# Patient Record
Sex: Female | Born: 1940 | Race: White | Hispanic: No | Marital: Single | Smoking: Never smoker
Health system: Southern US, Community
[De-identification: ages and names within clinical notes are randomized; demographics above are authoritative.]

## PROBLEM LIST (undated history)

## (undated) DIAGNOSIS — J039 Acute tonsillitis, unspecified: Secondary | ICD-10-CM

## (undated) DIAGNOSIS — K219 Gastro-esophageal reflux disease without esophagitis: Secondary | ICD-10-CM

## (undated) DIAGNOSIS — K589 Irritable bowel syndrome without diarrhea: Secondary | ICD-10-CM

## (undated) DIAGNOSIS — M797 Fibromyalgia: Secondary | ICD-10-CM

## (undated) DIAGNOSIS — N3281 Overactive bladder: Secondary | ICD-10-CM

## (undated) DIAGNOSIS — N951 Menopausal and female climacteric states: Secondary | ICD-10-CM

## (undated) DIAGNOSIS — I7300 Raynaud's syndrome without gangrene: Secondary | ICD-10-CM

## (undated) DIAGNOSIS — M549 Dorsalgia, unspecified: Secondary | ICD-10-CM

## (undated) DIAGNOSIS — D510 Vitamin B12 deficiency anemia due to intrinsic factor deficiency: Secondary | ICD-10-CM

## (undated) DIAGNOSIS — R9431 Abnormal electrocardiogram [ECG] [EKG]: Secondary | ICD-10-CM

## (undated) DIAGNOSIS — E119 Type 2 diabetes mellitus without complications: Secondary | ICD-10-CM

## (undated) DIAGNOSIS — I10 Essential (primary) hypertension: Secondary | ICD-10-CM

## (undated) DIAGNOSIS — F32A Depression, unspecified: Secondary | ICD-10-CM

## (undated) DIAGNOSIS — R519 Headache, unspecified: Secondary | ICD-10-CM

## (undated) DIAGNOSIS — E785 Hyperlipidemia, unspecified: Secondary | ICD-10-CM

## (undated) DIAGNOSIS — N183 Chronic kidney disease, stage 3 unspecified: Secondary | ICD-10-CM

## (undated) DIAGNOSIS — E559 Vitamin D deficiency, unspecified: Secondary | ICD-10-CM

## (undated) HISTORY — PX: OTHER SURGICAL HISTORY: SHX169

## (undated) HISTORY — DX: Irritable bowel syndrome, unspecified: K58.9

## (undated) HISTORY — DX: Depression, unspecified: F32.A

## (undated) HISTORY — DX: Menopausal and female climacteric states: N95.1

## (undated) HISTORY — DX: Abnormal electrocardiogram (ECG) (EKG): R94.31

## (undated) HISTORY — DX: Overactive bladder: N32.81

## (undated) HISTORY — DX: Vitamin B12 deficiency anemia due to intrinsic factor deficiency: D51.0

## (undated) HISTORY — DX: Fibromyalgia: M79.7

## (undated) HISTORY — DX: Acute tonsillitis, unspecified: J03.90

## (undated) HISTORY — DX: Vitamin D deficiency, unspecified: E55.9

## (undated) HISTORY — DX: Raynaud's syndrome without gangrene: I73.00

## (undated) HISTORY — DX: Gastro-esophageal reflux disease without esophagitis: K21.9

## (undated) HISTORY — DX: Dorsalgia, unspecified: M54.9

## (undated) HISTORY — PX: ABDOMINAL HYSTERECTOMY: SHX81

## (undated) HISTORY — DX: Headache, unspecified: R51.9

## (undated) HISTORY — DX: Chronic kidney disease, stage 3 unspecified: N18.30

---

## 2000-05-14 ENCOUNTER — Encounter: Payer: Self-pay | Admitting: Internal Medicine

## 2000-05-14 ENCOUNTER — Encounter: Admission: RE | Admit: 2000-05-14 | Discharge: 2000-05-14 | Payer: Self-pay | Admitting: Internal Medicine

## 2001-06-07 ENCOUNTER — Other Ambulatory Visit: Admission: RE | Admit: 2001-06-07 | Discharge: 2001-06-07 | Payer: Self-pay | Admitting: Internal Medicine

## 2002-02-28 ENCOUNTER — Encounter: Admission: RE | Admit: 2002-02-28 | Discharge: 2002-02-28 | Payer: Self-pay | Admitting: Internal Medicine

## 2002-02-28 ENCOUNTER — Encounter: Payer: Self-pay | Admitting: Internal Medicine

## 2002-11-09 ENCOUNTER — Other Ambulatory Visit: Admission: RE | Admit: 2002-11-09 | Discharge: 2002-11-09 | Payer: Self-pay | Admitting: Internal Medicine

## 2008-03-22 ENCOUNTER — Encounter: Admission: RE | Admit: 2008-03-22 | Discharge: 2008-03-22 | Payer: Self-pay | Admitting: Internal Medicine

## 2009-04-25 ENCOUNTER — Encounter: Admission: RE | Admit: 2009-04-25 | Discharge: 2009-04-25 | Payer: Self-pay | Admitting: Internal Medicine

## 2009-05-22 ENCOUNTER — Encounter: Admission: RE | Admit: 2009-05-22 | Discharge: 2009-05-22 | Payer: Self-pay | Admitting: Orthopedic Surgery

## 2009-09-16 ENCOUNTER — Encounter: Admission: RE | Admit: 2009-09-16 | Discharge: 2009-09-16 | Payer: Self-pay | Admitting: Internal Medicine

## 2009-09-17 ENCOUNTER — Encounter: Admission: RE | Admit: 2009-09-17 | Discharge: 2009-09-17 | Payer: Self-pay | Admitting: Internal Medicine

## 2011-02-13 ENCOUNTER — Ambulatory Visit: Payer: Self-pay | Admitting: *Deleted

## 2011-02-13 NOTE — Progress Notes (Signed)
Opened in error

## 2012-07-22 ENCOUNTER — Emergency Department (HOSPITAL_COMMUNITY): Payer: Medicare HMO

## 2012-07-22 ENCOUNTER — Emergency Department (HOSPITAL_COMMUNITY)
Admission: EM | Admit: 2012-07-22 | Discharge: 2012-07-23 | Disposition: A | Discharge status: Home / Self Care | Payer: Medicare HMO | Attending: Emergency Medicine | Admitting: Emergency Medicine

## 2012-07-22 ENCOUNTER — Encounter (HOSPITAL_COMMUNITY): Payer: Self-pay | Admitting: *Deleted

## 2012-07-22 ENCOUNTER — Other Ambulatory Visit: Payer: Self-pay

## 2012-07-22 DIAGNOSIS — E785 Hyperlipidemia, unspecified: Secondary | ICD-10-CM | POA: Insufficient documentation

## 2012-07-22 DIAGNOSIS — S2220XA Unspecified fracture of sternum, initial encounter for closed fracture: Secondary | ICD-10-CM | POA: Insufficient documentation

## 2012-07-22 DIAGNOSIS — Z79899 Other long term (current) drug therapy: Secondary | ICD-10-CM | POA: Insufficient documentation

## 2012-07-22 DIAGNOSIS — E119 Type 2 diabetes mellitus without complications: Secondary | ICD-10-CM | POA: Insufficient documentation

## 2012-07-22 DIAGNOSIS — Y92410 Unspecified street and highway as the place of occurrence of the external cause: Secondary | ICD-10-CM | POA: Insufficient documentation

## 2012-07-22 DIAGNOSIS — IMO0001 Reserved for inherently not codable concepts without codable children: Secondary | ICD-10-CM | POA: Insufficient documentation

## 2012-07-22 DIAGNOSIS — I10 Essential (primary) hypertension: Secondary | ICD-10-CM | POA: Insufficient documentation

## 2012-07-22 DIAGNOSIS — Y9389 Activity, other specified: Secondary | ICD-10-CM | POA: Insufficient documentation

## 2012-07-22 HISTORY — DX: Essential (primary) hypertension: I10

## 2012-07-22 HISTORY — DX: Hyperlipidemia, unspecified: E78.5

## 2012-07-22 HISTORY — DX: Type 2 diabetes mellitus without complications: E11.9

## 2012-07-22 LAB — POCT I-STAT, CHEM 8
BUN: 29 mg/dL — ABNORMAL HIGH (ref 6–23)
Calcium, Ion: 1.14 mmol/L (ref 1.13–1.30)
Chloride: 106 mEq/L (ref 96–112)
Creatinine, Ser: 1.3 mg/dL — ABNORMAL HIGH (ref 0.50–1.10)
Glucose, Bld: 134 mg/dL — ABNORMAL HIGH (ref 70–99)
HCT: 40 % (ref 36.0–46.0)
Hemoglobin: 13.6 g/dL (ref 12.0–15.0)
Potassium: 3.8 mEq/L (ref 3.5–5.1)
Sodium: 138 mEq/L (ref 135–145)
TCO2: 26 mmol/L (ref 0–100)

## 2012-07-22 LAB — POCT I-STAT TROPONIN I: Troponin i, poc: 0.01 ng/mL (ref 0.00–0.08)

## 2012-07-22 MED ORDER — OXYCODONE-ACETAMINOPHEN 5-325 MG PO TABS
1.0000 | ORAL_TABLET | Freq: Once | ORAL | Status: AC
  Administered 2012-07-23: 1 via ORAL
  Filled 2012-07-22: qty 1

## 2012-07-22 MED ORDER — PERCOCET 5-325 MG PO TABS: 1.0000 | ORAL_TABLET | Freq: Four times a day (QID) | ORAL | Status: DC | PRN

## 2012-07-22 MED ORDER — OXYCODONE-ACETAMINOPHEN 5-325 MG PO TABS
1.0000 | ORAL_TABLET | Freq: Once | ORAL | Status: AC
  Administered 2012-07-22: 1 via ORAL
  Filled 2012-07-22: qty 1

## 2012-07-22 MED ORDER — IBUPROFEN 800 MG PO TABS
800.0000 mg | ORAL_TABLET | Freq: Once | ORAL | Status: AC
  Administered 2012-07-22: 800 mg via ORAL
  Filled 2012-07-22: qty 1

## 2012-07-22 NOTE — ED Notes (Signed)
Bed:WA11<BR> Expected date:<BR> Expected time:<BR> Means of arrival:<BR> Comments:<BR> ems
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
## 2012-07-23 NOTE — ED Provider Notes (Signed)
Medical screening examination/treatment/procedure(s) were performed by non-physician practitioner and as supervising physician I was immediately available for consultation/collaboration.  Raeford Razor, MD 07/23/12 1444

## 2012-07-27 NOTE — ED Provider Notes (Signed)
Medical screening examination/treatment/procedure(s) were performed by non-physician practitioner and as supervising physician I was immediately available for consultation/collaboration.  Eymi Lipuma T Sabastian Raimondi, MD 07/27/12 2305 

## 2012-09-05 ENCOUNTER — Other Ambulatory Visit: Payer: Self-pay | Admitting: Internal Medicine

## 2012-09-05 ENCOUNTER — Ambulatory Visit
Admission: RE | Admit: 2012-09-05 | Discharge: 2012-09-05 | Disposition: A | Discharge status: Home / Self Care | Payer: Medicare HMO | Source: Ambulatory Visit | Attending: Internal Medicine | Admitting: Internal Medicine

## 2012-09-05 DIAGNOSIS — J939 Pneumothorax, unspecified: Secondary | ICD-10-CM

## 2013-02-13 ENCOUNTER — Other Ambulatory Visit: Payer: Self-pay | Admitting: Internal Medicine

## 2013-02-13 DIAGNOSIS — Z1231 Encounter for screening mammogram for malignant neoplasm of breast: Secondary | ICD-10-CM

## 2013-02-27 ENCOUNTER — Ambulatory Visit: Payer: Medicare HMO

## 2013-04-10 ENCOUNTER — Ambulatory Visit
Admission: RE | Admit: 2013-04-10 | Discharge: 2013-04-10 | Disposition: A | Discharge status: Home / Self Care | Payer: Medicare HMO | Source: Ambulatory Visit | Attending: Internal Medicine | Admitting: Internal Medicine

## 2013-04-10 DIAGNOSIS — Z1231 Encounter for screening mammogram for malignant neoplasm of breast: Secondary | ICD-10-CM

## 2013-04-11 ENCOUNTER — Other Ambulatory Visit: Payer: Self-pay | Admitting: Internal Medicine

## 2013-04-17 ENCOUNTER — Other Ambulatory Visit: Payer: Medicare HMO

## 2013-04-18 ENCOUNTER — Other Ambulatory Visit: Payer: Medicare HMO

## 2013-04-21 ENCOUNTER — Other Ambulatory Visit: Payer: Medicare HMO

## 2013-04-28 ENCOUNTER — Other Ambulatory Visit: Payer: Medicare HMO

## 2013-05-04 ENCOUNTER — Ambulatory Visit
Admission: RE | Admit: 2013-05-04 | Discharge: 2013-05-04 | Disposition: A | Discharge status: Home / Self Care | Payer: Medicare HMO | Source: Ambulatory Visit | Attending: Internal Medicine | Admitting: Internal Medicine

## 2013-05-04 MED ORDER — GADOBENATE DIMEGLUMINE 529 MG/ML IV SOLN
18.0000 mL | Freq: Once | INTRAVENOUS | Status: AC | PRN
  Administered 2013-05-04: 18 mL via INTRAVENOUS
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
## 2016-11-22 ENCOUNTER — Encounter (HOSPITAL_COMMUNITY): Payer: Self-pay | Admitting: Emergency Medicine

## 2016-11-22 ENCOUNTER — Emergency Department (HOSPITAL_COMMUNITY)
Admission: EM | Admit: 2016-11-22 | Discharge: 2016-11-22 | Disposition: A | Discharge status: Home / Self Care | Payer: Medicare Other | Attending: Emergency Medicine | Admitting: Emergency Medicine

## 2016-11-22 DIAGNOSIS — W57XXXA Bitten or stung by nonvenomous insect and other nonvenomous arthropods, initial encounter: Secondary | ICD-10-CM | POA: Insufficient documentation

## 2016-11-22 DIAGNOSIS — I10 Essential (primary) hypertension: Secondary | ICD-10-CM | POA: Diagnosis not present

## 2016-11-22 DIAGNOSIS — Y999 Unspecified external cause status: Secondary | ICD-10-CM | POA: Insufficient documentation

## 2016-11-22 DIAGNOSIS — Z79899 Other long term (current) drug therapy: Secondary | ICD-10-CM | POA: Insufficient documentation

## 2016-11-22 DIAGNOSIS — Z7984 Long term (current) use of oral hypoglycemic drugs: Secondary | ICD-10-CM | POA: Diagnosis not present

## 2016-11-22 DIAGNOSIS — E119 Type 2 diabetes mellitus without complications: Secondary | ICD-10-CM | POA: Insufficient documentation

## 2016-11-22 DIAGNOSIS — Y929 Unspecified place or not applicable: Secondary | ICD-10-CM | POA: Diagnosis not present

## 2016-11-22 DIAGNOSIS — Y939 Activity, unspecified: Secondary | ICD-10-CM | POA: Insufficient documentation

## 2016-11-22 DIAGNOSIS — S00462A Insect bite (nonvenomous) of left ear, initial encounter: Secondary | ICD-10-CM | POA: Diagnosis not present

## 2016-11-22 DIAGNOSIS — Z7982 Long term (current) use of aspirin: Secondary | ICD-10-CM | POA: Diagnosis not present

## 2016-11-22 DIAGNOSIS — L03811 Cellulitis of head [any part, except face]: Secondary | ICD-10-CM

## 2016-11-22 MED ORDER — CLINDAMYCIN HCL 300 MG PO CAPS: 300.0000 mg | ORAL_CAPSULE | Freq: Four times a day (QID) | ORAL | 0 refills | Status: DC

## 2016-11-22 MED ORDER — CLINDAMYCIN PHOSPHATE 900 MG/50ML IV SOLN
900.0000 mg | Freq: Once | INTRAVENOUS | Status: AC
  Administered 2016-11-22: 900 mg via INTRAVENOUS
  Filled 2016-11-22: qty 50

## 2016-11-22 NOTE — ED Provider Notes (Signed)
[facility]-EMERGENCY DEPT Provider Note   CSN: [id number] Arrival date & time: 11/22/16  1726     History   Chief Complaint Chief Complaint  Patient presents with  . Insect Bite    HPI Jo Levy is a 76 y.o. female. CC: Red swollen ear after tick bite.  HPI:  76 year old female who started noticing that her left ear was red and tender on Tuesday, 5 days ago. 3 days ago she put her fingernail into one of the folds of her upper anterior ear and produced a tick. She states it was only a trace of blood.  Her ear has continued red and swollen since that time and she presents here. She feels well in general. She does not have fever shakes or chills.  Past Medical History:  Diagnosis Date  . Diabetes mellitus without complication (HCC)   . Hyperlipemia   . Hypertension     There are no active problems to display for this patient.   History reviewed. No pertinent surgical history.  OB History    No data available       Home Medications    Prior to Admission medications   Medication Sig Start Date End Date Taking? Authorizing Provider  acetaminophen (TYLENOL) 325 MG tablet Take 650 mg by mouth every 6 (six) hours as needed (pain).   Yes [provider]  aspirin EC 81 MG tablet Take 81 mg by mouth daily.   Yes [provider]  Cholecalciferol (VITAMIN D PO) Take 1 tablet by mouth daily.   Yes [provider]  escitalopram (LEXAPRO) 20 MG tablet Take 20 mg by mouth every morning.   Yes [provider]  estradiol (ESTRACE) 1 MG tablet Take 1 mg by mouth every morning.   Yes [provider]  lisinopril-hydrochlorothiazide (PRINZIDE,ZESTORETIC) 20-12.5 MG tablet Take 1 tablet by mouth daily. 09/03/16  Yes [provider]  metFORMIN (GLUCOPHAGE-XR) 500 MG 24 hr tablet Take 500 mg by mouth daily. 09/20/16  Yes [provider]  VITAMIN E PO Take 1 tablet by mouth daily.   Yes [provider]  clindamycin  (CLEOCIN) 300 MG capsule Take 1 capsule (300 mg total) by mouth every 6 (six) hours. 11/22/16   Rolland PorterJames, Joram Venson, MD    Family History No family history on file.  Social History Social History  Substance Use Topics  . Smoking status: Never Smoker  . Smokeless tobacco: Not on file  . Alcohol use No     Allergies   Iodine; Iohexol; and Penicillins   Review of Systems Review of Systems  Constitutional: Negative for appetite change, chills, diaphoresis, fatigue and fever.  HENT: Positive for ear pain. Negative for mouth sores, sore throat and trouble swallowing.   Eyes: Negative for visual disturbance.  Respiratory: Negative for cough, chest tightness, shortness of breath and wheezing.   Cardiovascular: Negative for chest pain.  Gastrointestinal: Negative for abdominal distention, abdominal pain, diarrhea, nausea and vomiting.  Endocrine: Negative for polydipsia, polyphagia and polyuria.  Genitourinary: Negative for dysuria, frequency and hematuria.  Musculoskeletal: Negative for gait problem.  Skin: Negative for color change, pallor and rash.  Neurological: Negative for dizziness, syncope, light-headedness and headaches.  Hematological: Does not bruise/bleed easily.  Psychiatric/Behavioral: Negative for behavioral problems and confusion.     Physical Exam Updated Vital Signs BP (!) 146/62 (BP Location: Left Arm)   Pulse 67   Temp 98.1 F (36.7 C) (Oral)   Resp 20   Ht 5\' 6"  (1.676 m)  Wt 186 lb (84.4 kg)   SpO2 100%   BMI 30.02 kg/m   Physical Exam  Constitutional: She is oriented to person, place, and time. She appears well-developed and well-nourished. No distress.  HENT:  Head: Normocephalic.  Left ear diffusely erythematous. No localized abscess. No sign of fluid collection or separation of skin and cartilage. Smooth contours. No mastoiditis or erythema over the scalp. Normal canal.  Eyes: Conjunctivae are normal. Pupils are equal, round, and reactive to light. No  scleral icterus.  Neck: Normal range of motion. Neck supple. No thyromegaly present.  Cardiovascular: Normal rate and regular rhythm.  Exam reveals no gallop and no friction rub.   No murmur heard. Pulmonary/Chest: Effort normal and breath sounds normal. No respiratory distress. She has no wheezes. She has no rales.  Abdominal: Soft. Bowel sounds are normal. She exhibits no distension. There is no tenderness. There is no rebound.  Musculoskeletal: Normal range of motion.  Neurological: She is alert and oriented to person, place, and time.  Skin: Skin is warm and dry. No rash noted.  Psychiatric: She has a normal mood and affect. Her behavior is normal.     ED Treatments / Results  Labs (all labs ordered are listed, but only abnormal results are displayed) Labs Reviewed - No data to display  EKG  EKG Interpretation None       Radiology No results found.  Procedures Procedures (including critical care time)  Medications Ordered in ED Medications  clindamycin (CLEOCIN) IVPB 900 mg (900 mg Intravenous New Bag/Given 11/22/16 2030)     Initial Impression / Assessment and Plan / ED Course  I have reviewed the triage vital signs and the nursing notes.  Pertinent labs & imaging results that were available during my care of the patient were reviewed by me and considered in my medical decision making (see chart for details).     Ear cellulitis. Given IV clindamycin and she does not have tolerance of antibiotics or cephalosporins. Plan discharge home. 300 clindamycin 3 times a day. Primary care not resolving. ER with acute changes.  Final Clinical Impressions(s) / ED Diagnoses   Final diagnoses:  Cellulitis of head except face    New Prescriptions New Prescriptions   CLINDAMYCIN (CLEOCIN) 300 MG CAPSULE    Take 1 capsule (300 mg total) by mouth every 6 (six) hours.     Rolland Porter, MD 11/22/16 2132

## 2016-11-22 NOTE — ED Notes (Signed)
PT DISCHARGED. INSTRUCTIONS AND PRESCRIPTION GIVEN. AAOX4. PT IN NO APPARENT DISTRESS OR PAIN. THE OPPORTUNITY TO ASK QUESTIONS WAS PROVIDED. 

## 2016-11-22 NOTE — Discharge Instructions (Signed)
Follow-up with your physician if not improving within the next 48-72 hours.

## 2016-11-22 NOTE — ED Triage Notes (Signed)
Pt from home following a tick bite on the upper inner aspect of her left ear. Pt states she thinks the tick was attached for a few days. Pt has swelling on the outer area of her ear that is warm to the touch. Pt states she went to an urgent care and was told to immediately come here.

## 2016-12-30 ENCOUNTER — Ambulatory Visit
Admission: RE | Admit: 2016-12-30 | Discharge: 2016-12-30 | Disposition: A | Discharge status: Home / Self Care | Payer: Medicare Other | Source: Ambulatory Visit | Attending: Internal Medicine | Admitting: Internal Medicine

## 2016-12-30 ENCOUNTER — Other Ambulatory Visit: Payer: Self-pay | Admitting: Internal Medicine

## 2016-12-30 DIAGNOSIS — M545 Low back pain, unspecified: Secondary | ICD-10-CM

## 2017-01-12 ENCOUNTER — Other Ambulatory Visit: Payer: Self-pay | Admitting: Internal Medicine

## 2017-01-12 DIAGNOSIS — Z1231 Encounter for screening mammogram for malignant neoplasm of breast: Secondary | ICD-10-CM

## 2017-01-26 ENCOUNTER — Ambulatory Visit
Admission: RE | Admit: 2017-01-26 | Discharge: 2017-01-26 | Disposition: A | Discharge status: Home / Self Care | Payer: Medicare Other | Source: Ambulatory Visit | Attending: Internal Medicine | Admitting: Internal Medicine

## 2017-01-26 DIAGNOSIS — Z1231 Encounter for screening mammogram for malignant neoplasm of breast: Secondary | ICD-10-CM

## 2017-01-29 ENCOUNTER — Other Ambulatory Visit: Payer: Self-pay | Admitting: Internal Medicine

## 2017-01-29 DIAGNOSIS — N644 Mastodynia: Secondary | ICD-10-CM

## 2017-02-10 ENCOUNTER — Inpatient Hospital Stay: Admission: RE | Admit: 2017-02-10 | Payer: Medicare Other | Source: Ambulatory Visit

## 2017-02-10 ENCOUNTER — Other Ambulatory Visit: Payer: Self-pay | Admitting: Internal Medicine

## 2017-02-10 DIAGNOSIS — N644 Mastodynia: Secondary | ICD-10-CM

## 2017-02-15 ENCOUNTER — Other Ambulatory Visit: Payer: Self-pay | Admitting: Orthopaedic Surgery

## 2017-02-15 DIAGNOSIS — M546 Pain in thoracic spine: Secondary | ICD-10-CM

## 2017-02-17 ENCOUNTER — Ambulatory Visit
Admission: RE | Admit: 2017-02-17 | Discharge: 2017-02-17 | Disposition: A | Discharge status: Home / Self Care | Payer: Medicare Other | Source: Ambulatory Visit | Attending: Internal Medicine | Admitting: Internal Medicine

## 2017-02-17 ENCOUNTER — Ambulatory Visit: Payer: Medicare Other

## 2017-02-17 ENCOUNTER — Other Ambulatory Visit: Payer: Self-pay | Admitting: Internal Medicine

## 2017-02-17 DIAGNOSIS — N644 Mastodynia: Secondary | ICD-10-CM

## 2017-03-09 ENCOUNTER — Ambulatory Visit
Admission: RE | Admit: 2017-03-09 | Discharge: 2017-03-09 | Disposition: A | Discharge status: Home / Self Care | Payer: Medicare Other | Source: Ambulatory Visit | Attending: Orthopaedic Surgery | Admitting: Orthopaedic Surgery

## 2017-03-09 DIAGNOSIS — M546 Pain in thoracic spine: Secondary | ICD-10-CM

## 2017-03-17 ENCOUNTER — Other Ambulatory Visit: Payer: Self-pay | Admitting: Orthopaedic Surgery

## 2017-03-17 DIAGNOSIS — M545 Low back pain: Secondary | ICD-10-CM

## 2017-03-31 ENCOUNTER — Ambulatory Visit
Admission: RE | Admit: 2017-03-31 | Discharge: 2017-03-31 | Disposition: A | Discharge status: Home / Self Care | Payer: Medicare Other | Source: Ambulatory Visit | Attending: Orthopaedic Surgery | Admitting: Orthopaedic Surgery

## 2017-03-31 ENCOUNTER — Other Ambulatory Visit: Payer: Self-pay | Admitting: Internal Medicine

## 2017-03-31 DIAGNOSIS — M545 Low back pain: Secondary | ICD-10-CM

## 2017-03-31 DIAGNOSIS — J984 Other disorders of lung: Secondary | ICD-10-CM

## 2017-04-02 ENCOUNTER — Other Ambulatory Visit: Payer: Medicare Other

## 2017-04-07 ENCOUNTER — Ambulatory Visit
Admission: RE | Admit: 2017-04-07 | Discharge: 2017-04-07 | Disposition: A | Discharge status: Home / Self Care | Payer: Medicare Other | Source: Ambulatory Visit | Attending: Internal Medicine | Admitting: Internal Medicine

## 2017-04-07 DIAGNOSIS — J984 Other disorders of lung: Secondary | ICD-10-CM

## 2020-03-27 ENCOUNTER — Other Ambulatory Visit: Payer: Self-pay | Admitting: Internal Medicine

## 2020-03-27 DIAGNOSIS — R5381 Other malaise: Secondary | ICD-10-CM

## 2020-04-01 ENCOUNTER — Other Ambulatory Visit: Payer: Self-pay | Admitting: Internal Medicine

## 2020-04-01 DIAGNOSIS — E559 Vitamin D deficiency, unspecified: Secondary | ICD-10-CM

## 2021-08-07 ENCOUNTER — Ambulatory Visit: Payer: Medicare Other | Admitting: Orthopaedic Surgery

## 2022-06-19 ENCOUNTER — Other Ambulatory Visit: Payer: Self-pay | Admitting: Internal Medicine

## 2022-06-19 ENCOUNTER — Ambulatory Visit
Admission: RE | Admit: 2022-06-19 | Discharge: 2022-06-19 | Disposition: A | Discharge status: Home / Self Care | Payer: Medicare Other | Source: Ambulatory Visit | Attending: Internal Medicine | Admitting: Internal Medicine

## 2022-06-19 ENCOUNTER — Encounter: Payer: Self-pay | Admitting: Internal Medicine

## 2022-06-19 DIAGNOSIS — M25511 Pain in right shoulder: Secondary | ICD-10-CM

## 2022-12-23 ENCOUNTER — Telehealth: Payer: Self-pay

## 2022-12-23 NOTE — Telephone Encounter (Signed)
LVM to return call to schedule OV 

## 2022-12-23 NOTE — Telephone Encounter (Signed)
-----   Message from Tyrell Antonio, MD sent at 12/21/2022  4:13 PM EDT ----- Ov  ----- Message ----- From: Sharlet Salina, CMA Sent: 12/21/2022   2:37 PM EDT To: Tyrell Antonio, MD; Juanda Chance, NP   ----- Message ----- From: Dorcas Mcmurray Sent: 12/21/2022   2:30 PM EDT To: Sharlet Salina, CMA

## 2022-12-24 ENCOUNTER — Telehealth: Payer: Self-pay | Admitting: Physical Medicine and Rehabilitation

## 2022-12-24 NOTE — Telephone Encounter (Signed)
Pt called requesting a cal back from brittany. Pt states a referral was sent for her but she has a few questions before making appt. Please call at [phone]

## 2022-12-29 ENCOUNTER — Telehealth: Payer: Self-pay

## 2022-12-29 NOTE — Telephone Encounter (Signed)
-----   Message from Frederic Newton, MD sent at 12/21/2022  4:13 PM EDT ----- Ov  ----- Message ----- From: Joci Dress H, CMA Sent: 12/21/2022   2:37 PM EDT To: Frederic Newton, MD; Megan E Williams, NP   ----- Message ----- From: Hughes, Tammy Sent: 12/21/2022   2:30 PM EDT To: Jermichael Belmares H Tymir Terral, CMA    

## 2022-12-29 NOTE — Telephone Encounter (Signed)
Spoke with patient and she questioned what exactly could be done for her back with her arthritis. After explaining that we can do back injections, short term medication and PT, se stated that this is not where she thinks she needs to be. She stated that she had back injections before an they did not help at all. She is going to talk with Dr. Ludwig Clarks to let him know what she explained to me.

## 2022-12-29 NOTE — Telephone Encounter (Signed)
Spoke with patient and she questioned what exactly could be done for her back with her arthritis. After explaining that we can do back injections, short term medication and PT, se stated that this is not where she thinks she needs to be. She stated that she had back injections before an they did not help at all. She is going to talk with Dr. Moreira to let him know what she explained to me.  

## 2023-10-13 ENCOUNTER — Ambulatory Visit: Payer: Medicare Other | Attending: Cardiology | Admitting: Cardiology

## 2023-10-13 ENCOUNTER — Encounter: Payer: Self-pay | Admitting: Cardiology

## 2023-10-13 VITALS — BP 138/82 | HR 64 | Resp 16 | Ht 66.0 in | Wt 182.6 lb

## 2023-10-13 DIAGNOSIS — R0609 Other forms of dyspnea: Secondary | ICD-10-CM | POA: Insufficient documentation

## 2023-10-13 DIAGNOSIS — R011 Cardiac murmur, unspecified: Secondary | ICD-10-CM | POA: Diagnosis not present

## 2023-10-13 NOTE — Progress Notes (Signed)
 Cardiology Office Note:  .   Date:  10/13/2023  ID:  Jo Levy, DOB 23-Feb-1941, MRN [id number] PCP: Ralene Ok, MD  [facility] Providers Cardiologist:  Truett Mainland, MD PCP: Ralene Ok, MD  Chief Complaint  Patient presents with   Shortness of Breath   Follow-up     Jo Levy is a 83 y.o. female with exertional dyspnea  Discussed the use of AI scribe software for clinical note transcription with the patient, who gave verbal consent to proceed.  History of Present Illness The patient, with a history of COPD, presents with fatigue and shortness of breath. She reports a significant decrease in activity level over the past year, with a marked decline in the past few months. She describes feeling 'exhausted all the time' and needing to sleep frequently. She also reports difficulty with tasks she used to perform easily, such as housework. She has to rest after walking to the mailbox due to shortness of breath. She denies chest pain but reports a sensation of not being able to take a deep breath and back pain. She has a history of significant secondhand smoke exposure but denies personal history of smoking. She has a family history of esophageal cancer. She also has a history of diabetes and hypertension, for which she takes metformin and lisinopril hydrochlorothiazide, respectively.      There were no vitals filed for this visit.    Review of Systems  Constitutional: Positive for malaise/fatigue.  Cardiovascular:  Positive for dyspnea on exertion. Negative for chest pain, leg swelling, palpitations and syncope.        Studies Reviewed: Marland Kitchen        EKG 10/13/2023: Normal sinus rhythm Normal ECG    Independently interpreted Cr 1.5  CTA chest 07/2023: No PE Mosaic pattern of dominant mass throughout both lungs No coronary calcification No acute thoracic aortic abnormalities Aberrant right subclavian artery    Physical Exam Vitals and  nursing note reviewed.  Constitutional:      General: She is not in acute distress. Neck:     Vascular: No JVD.  Cardiovascular:     Rate and Rhythm: Normal rate and regular rhythm.     Heart sounds: Murmur heard.     Harsh midsystolic murmur is present with a grade of 2/6 at the upper right sternal border radiating to the neck.  Pulmonary:     Effort: Pulmonary effort is normal.     Breath sounds: Normal breath sounds. No wheezing or rales.  Musculoskeletal:     Right lower leg: No edema.     Left lower leg: No edema.      VISIT DIAGNOSES:   ICD-10-CM   1. Exertional dyspnea  R06.09 EKG 12-Lead    Pro b natriuretic peptide (BNP)    2. Murmur  R01.1 ECHOCARDIOGRAM COMPLETE       Jo Levy is a 83 y.o. female with exertional dyspnea  Assessment & Plan  Exertional dyspnea: Physical exam remarkable for systolic murmur, likely moderate aortic stenosis.  However, she is euvolemic on exam.  I do not think this explains her exertional dyspnea, but will likely need surveillance echocardiogram in the future.  Will check proBNP.  Recent CT chest did show groundglass opacities, suspected.  Continue follow-up with PCP regarding further management for possible COPD. Plan to obtain recent lab results for comprehensive evaluation. - Obtain recent lab results from primary care physician's office.     F/u as needed  Signed, Elder Negus, MD

## 2023-10-13 NOTE — Patient Instructions (Signed)
 Medication Instructions:   Your physician recommends that you continue on your current medications as directed. Please refer to the Current Medication list given to you today.  *If you need a refill on your cardiac medications before your next appointment, please call your pharmacy*  Lab Work:  TODAY--DOWNSTAIRS FIRST FLOOR AT [facility]  If you have labs (blood work) drawn today and your tests are completely normal, you will receive your results only by: MyChart Message (if you have MyChart) OR A paper copy in the mail If you have any lab test that is abnormal or we need to change your treatment, we will call you to review the results.   Testing/Procedures:  Your physician has requested that you have an echocardiogram. Echocardiography is a painless test that uses sound waves to create images of your heart. It provides your doctor with information about the size and shape of your heart and how well your heart's chambers and valves are working. This procedure takes approximately one hour. There are no restrictions for this procedure. Please do NOT wear cologne, perfume, aftershave, or lotions (deodorant is allowed). Please arrive 15 minutes prior to your appointment time.  Please note: We ask at that you not bring children with you during ultrasound (echo/ vascular) testing. Due to room size and safety concerns, children are not allowed in the ultrasound rooms during exams. Our front office staff cannot provide observation of children in our lobby area while testing is being conducted. An adult accompanying a patient to their appointment will only be allowed in the ultrasound room at the discretion of the ultrasound technician under special circumstances. We apologize for any inconvenience.   Follow-Up:  AS NEEDED WITH DR. PATWARDHAN         1st Floor: - Lobby - Registration  - Pharmacy  - Lab - Cafe  2nd Floor: - PV Lab - Diagnostic Testing (echo, CT, nuclear  med)  3rd Floor: - Vacant  4th Floor: - TCTS (cardiothoracic surgery) - AFib Clinic - Structural Heart Clinic - Vascular Surgery  - Vascular Ultrasound  5th Floor: - HeartCare Cardiology (general and EP) - Clinical Pharmacy for coumadin, hypertension, lipid, weight-loss medications, and med management appointments    Valet parking services will be available as well.

## 2023-10-14 LAB — PRO B NATRIURETIC PEPTIDE: NT-Pro BNP: 359 pg/mL (ref 0–738)

## 2023-10-14 NOTE — Progress Notes (Signed)
 proBNP is normal.  I do not think shortness of breath is due to cardiac cause. Will await echocardiogram to further comment on heart valves.  Thanks MJP

## 2023-11-22 ENCOUNTER — Ambulatory Visit (HOSPITAL_COMMUNITY): Attending: Cardiology

## 2023-11-22 DIAGNOSIS — R011 Cardiac murmur, unspecified: Secondary | ICD-10-CM | POA: Diagnosis present

## 2023-11-22 LAB — ECHOCARDIOGRAM COMPLETE
AR max vel: 1.91 cm2
AV Area VTI: 2.04 cm2
AV Area mean vel: 1.94 cm2
AV Mean grad: 9 mmHg
AV Peak grad: 16.6 mmHg
Ao pk vel: 2.04 m/s
Area-P 1/2: 2.6 cm2
S' Lateral: 2.2 cm

## 2023-11-22 NOTE — Progress Notes (Signed)
 Normal heart function.  Mild leakiness of aortic valve, likely cause of murmur.  Repeat echocardiogram in in 1 year.  Thanks MJP

## 2023-11-23 ENCOUNTER — Telehealth: Payer: Self-pay | Admitting: Cardiology

## 2023-11-23 ENCOUNTER — Ambulatory Visit: Payer: Self-pay | Admitting: *Deleted

## 2023-11-23 DIAGNOSIS — I351 Nonrheumatic aortic (valve) insufficiency: Secondary | ICD-10-CM

## 2023-11-23 NOTE — Telephone Encounter (Signed)
Pt returning call in regards to results. Please advise 

## 2023-11-23 NOTE — Telephone Encounter (Signed)
 The patient has been notified of the result and verbalized understanding.  All questions (if any) were answered.  Pt aware we will order for her to get a repeat echo done in one year for surveillance.  Pt aware we will input the order and send a message to our echo scheduler to call her back closer to that time frame to arrange this appt.   Pt verbalized understanding and agrees with this plan.    Cody Das, MD 11/22/2023  5:00 PM EDT     Normal heart function.  Mild leakiness of aortic valve, likely cause of murmur.  Repeat echocardiogram in in 1 year.   Thanks MJP

## 2023-11-25 ENCOUNTER — Encounter: Payer: Self-pay | Admitting: Pulmonary Disease

## 2023-11-25 ENCOUNTER — Ambulatory Visit: Admitting: Pulmonary Disease

## 2023-11-25 VITALS — BP 118/66 | HR 69 | Temp 98.9°F | Ht 64.5 in | Wt 183.2 lb

## 2023-11-25 DIAGNOSIS — R0609 Other forms of dyspnea: Secondary | ICD-10-CM | POA: Diagnosis not present

## 2023-11-25 DIAGNOSIS — R918 Other nonspecific abnormal finding of lung field: Secondary | ICD-10-CM

## 2023-11-25 DIAGNOSIS — I352 Nonrheumatic aortic (valve) stenosis with insufficiency: Secondary | ICD-10-CM

## 2023-11-25 DIAGNOSIS — R5383 Other fatigue: Secondary | ICD-10-CM

## 2023-11-25 DIAGNOSIS — J439 Emphysema, unspecified: Secondary | ICD-10-CM
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
## 2023-11-25 NOTE — Patient Instructions (Signed)
 VISIT SUMMARY:  Today, you were seen for an evaluation of your lung condition, specifically COPD. You have been experiencing persistent fatigue and shortness of breath, especially during activities like grocery shopping. You have been using the Trelegy inhaler since February 2025, which you find beneficial. We discussed your history of secondhand smoke exposure and recent imaging results, including ground glass opacities in your lungs and moderate aortic stenosis. We also talked about your goals of care, emphasizing quality of life over longevity.  YOUR PLAN:  -COPD: COPD, or Chronic Obstructive Pulmonary Disease, is a chronic inflammatory lung disease that causes obstructed airflow from the lungs. You have been experiencing symptoms like fatigue and shortness of breath. We will continue your Trelegy inhaler as it seems to help, and we will order a lung function test to confirm the diagnosis.  -GROUND GLASS OPACITIES IN LUNGS: Ground glass opacities are hazy areas seen on a CT scan that can indicate fluid, inflammation, or infection in the lungs. To better understand the cause, we will order a high resolution CT scan of your chest.  -GOALS OF CARE: We discussed your preference for quality of life over prolonging life in a state of suffering. This will guide our approach to your treatment and care.  INSTRUCTIONS:  Please schedule a lung function test and a high resolution CT scan of your chest as soon as possible. Continue using your Trelegy inhaler as prescribed. Follow up with your cardiologist as needed for your aortic stenosis. If you experience any new or worsening symptoms, please contact our office immediately.

## 2023-12-08 ENCOUNTER — Ambulatory Visit
Admission: RE | Admit: 2023-12-08 | Discharge: 2023-12-08 | Disposition: A | Discharge status: Home / Self Care | Source: Ambulatory Visit | Attending: Pulmonary Disease | Admitting: Pulmonary Disease

## 2023-12-08 DIAGNOSIS — R0609 Other forms of dyspnea: Secondary | ICD-10-CM

## 2023-12-27 ENCOUNTER — Ambulatory Visit: Payer: Self-pay | Admitting: Pulmonary Disease

## 2024-03-07 ENCOUNTER — Encounter

## 2024-03-07 ENCOUNTER — Ambulatory Visit: Admitting: Pulmonary Disease

## 2024-04-19 ENCOUNTER — Ambulatory Visit: Admitting: Pulmonary Disease

## 2024-04-19 ENCOUNTER — Encounter
